# Patient Record
Sex: Male | Born: 1965 | Race: White | Hispanic: No | Marital: Single | Smoking: Never smoker
Health system: Southern US, Community
[De-identification: ages and names within clinical notes are randomized; demographics above are authoritative.]

## PROBLEM LIST (undated history)

## (undated) DIAGNOSIS — I10 Essential (primary) hypertension: Secondary | ICD-10-CM

## (undated) DIAGNOSIS — E119 Type 2 diabetes mellitus without complications: Secondary | ICD-10-CM

---

## 2019-06-02 ENCOUNTER — Encounter: Payer: Self-pay | Admitting: Emergency Medicine

## 2019-06-02 ENCOUNTER — Emergency Department: Payer: Medicaid - Out of State

## 2019-06-02 ENCOUNTER — Emergency Department
Admission: EM | Admit: 2019-06-02 | Discharge: 2019-06-02 | Disposition: A | Discharge status: Home / Self Care | Payer: Medicaid - Out of State | Attending: Emergency Medicine | Admitting: Emergency Medicine

## 2019-06-02 ENCOUNTER — Other Ambulatory Visit: Payer: Self-pay

## 2019-06-02 DIAGNOSIS — N50812 Left testicular pain: Secondary | ICD-10-CM | POA: Diagnosis not present

## 2019-06-02 DIAGNOSIS — K4020 Bilateral inguinal hernia, without obstruction or gangrene, not specified as recurrent: Secondary | ICD-10-CM | POA: Diagnosis not present

## 2019-06-02 DIAGNOSIS — I10 Essential (primary) hypertension: Secondary | ICD-10-CM | POA: Diagnosis not present

## 2019-06-02 DIAGNOSIS — E119 Type 2 diabetes mellitus without complications: Secondary | ICD-10-CM | POA: Insufficient documentation

## 2019-06-02 DIAGNOSIS — R1031 Right lower quadrant pain: Secondary | ICD-10-CM

## 2019-06-02 DIAGNOSIS — N50811 Right testicular pain: Secondary | ICD-10-CM | POA: Insufficient documentation

## 2019-06-02 DIAGNOSIS — N50819 Testicular pain, unspecified: Secondary | ICD-10-CM

## 2019-06-02 HISTORY — DX: Type 2 diabetes mellitus without complications: E11.9

## 2019-06-02 HISTORY — DX: Essential (primary) hypertension: I10

## 2019-06-02 LAB — COMPREHENSIVE METABOLIC PANEL
ALT: 24 U/L (ref 0–44)
AST: 23 U/L (ref 15–41)
Albumin: 3.7 g/dL (ref 3.5–5.0)
Alkaline Phosphatase: 66 U/L (ref 38–126)
Anion gap: 13 (ref 5–15)
BUN: 13 mg/dL (ref 6–20)
CO2: 18 mmol/L — ABNORMAL LOW (ref 22–32)
Calcium: 9.2 mg/dL (ref 8.9–10.3)
Chloride: 104 mmol/L (ref 98–111)
Creatinine, Ser: 1.06 mg/dL (ref 0.61–1.24)
GFR calc Af Amer: >60 mL/min (ref >60)
GFR calc non Af Amer: >60 mL/min (ref >60)
Glucose, Bld: 371 mg/dL — ABNORMAL HIGH (ref 70–99)
Potassium: 3.8 mmol/L (ref 3.5–5.1)
Sodium: 135 mmol/L (ref 135–145)
Total Bilirubin: 0.7 mg/dL (ref 0.3–1.2)
Total Protein: 7.1 g/dL (ref 6.5–8.1)

## 2019-06-02 LAB — CBC
HCT: 44.1 % (ref 39.0–52.0)
Hemoglobin: 15.6 g/dL (ref 13.0–17.0)
MCH: 28.5 pg (ref 26.0–34.0)
MCHC: 35.4 g/dL (ref 30.0–36.0)
MCV: 80.5 fL (ref 80.0–100.0)
Platelets: 239 10*3/uL (ref 150–400)
RBC: 5.48 MIL/uL (ref 4.22–5.81)
RDW: 13.2 % (ref 11.5–15.5)
WBC: 7.4 10*3/uL (ref 4.0–10.5)
nRBC: 0 % (ref 0.0–0.2)

## 2019-06-02 LAB — URINALYSIS, COMPLETE (UACMP) WITH MICROSCOPIC
Bacteria, UA: NONE SEEN
Bilirubin Urine: NEGATIVE
Glucose, UA: >=500 mg/dL — AB
Hgb urine dipstick: NEGATIVE
Ketones, ur: NEGATIVE mg/dL
Leukocytes,Ua: NEGATIVE
Nitrite: NEGATIVE
Protein, ur: NEGATIVE mg/dL
Specific Gravity, Urine: 1.026 (ref 1.005–1.030)
pH: 7 (ref 5.0–8.0)

## 2019-06-02 LAB — LACTIC ACID, PLASMA: Lactic Acid, Venous: 1.8 mmol/L (ref 0.5–1.9)

## 2019-06-02 LAB — LIPASE, BLOOD: Lipase: 42 U/L (ref 11–51)

## 2019-06-02 MED ORDER — OXYCODONE-ACETAMINOPHEN 5-325 MG PO TABS: 1.0000 | ORAL_TABLET | ORAL | 0 refills | Status: AC | PRN

## 2019-06-02 MED ORDER — OXYCODONE-ACETAMINOPHEN 5-325 MG PO TABS
1.0000 | ORAL_TABLET | ORAL | Status: DC | PRN
  Administered 2019-06-02: 1 via ORAL
  Filled 2019-06-02: qty 1

## 2019-06-02 MED ORDER — ONDANSETRON HCL 4 MG/2ML IJ SOLN
4.0000 mg | Freq: Once | INTRAMUSCULAR | Status: AC
  Administered 2019-06-02: 4 mg via INTRAVENOUS
  Filled 2019-06-02: qty 2

## 2019-06-02 MED ORDER — LACTATED RINGERS IV BOLUS
1000.0000 mL | Freq: Once | INTRAVENOUS | Status: AC
  Administered 2019-06-02: 1000 mL via INTRAVENOUS

## 2019-06-02 MED ORDER — IOHEXOL 300 MG/ML  SOLN
125.0000 mL | Freq: Once | INTRAMUSCULAR | Status: AC | PRN
  Administered 2019-06-02: 125 mL via INTRAVENOUS

## 2019-06-02 MED ORDER — MORPHINE SULFATE (PF) 4 MG/ML IV SOLN
4.0000 mg | Freq: Once | INTRAVENOUS | Status: AC
  Administered 2019-06-02: 4 mg via INTRAVENOUS
  Filled 2019-06-02: qty 1

## 2019-06-02 MED ORDER — LISINOPRIL 5 MG PO TABS: 5.0000 mg | ORAL_TABLET | Freq: Every day | ORAL | 0 refills | Status: AC

## 2019-06-02 NOTE — ED Notes (Signed)
Pt st "I haven't taken my BP medication in a while' I I do not have insurance". St last time he took medication "moveber 2019".

## 2019-06-02 NOTE — ED Notes (Addendum)
Pt unable to provide a urine specimen at this time. Given specimen cup for when is able to do so. Pt wheeled to [place].

## 2019-06-02 NOTE — ED Provider Notes (Signed)
[facility] Emergency Department Provider Note   ____________________________________________   First MD Initiated Contact with Patient 06/02/19 1147     (approximate)  I have reviewed the triage vital signs and the nursing notes.   HISTORY  Chief Complaint Abdominal Pain    HPI Cory Carey is a 53 y.o. male with possible history of hypertension and diabetes presents to the ED complaining of abdominal pain.  Patient reports that he was diagnosed with a hernia in the right side of his groin about 1 year ago.  He states he has been developing increasing pain in his right groin and right lower quadrant over the past 2 days and pain has now become severe.  He states the right inguinal region is very tender to palpation, but he has not had any nausea, vomiting, changes in bowel movements, dysuria, or hematuria.  He is concerned the pain is related to the hernia as he has seen swelling in this area.  He has never had issues with a hernia before.        Past Medical History:  Diagnosis Date  . Diabetes mellitus without complication ([place])   . Hypertension     There are no problems to display for this patient.   History reviewed. No pertinent surgical history.  Prior to Admission medications   Medication Sig Start Date End Date Taking? Authorizing Provider  oxyCODONE-acetaminophen (PERCOCET) 5-325 MG tablet Take 1 tablet by mouth every 4 (four) hours as needed for severe pain. 06/02/19 06/01/20  Blake Divine, MD    Allergies Patient has no known allergies.  No family history on file.  Social History Social History   Tobacco Use  . Smoking status: Never Smoker  . Smokeless tobacco: Never Used  Substance Use Topics  . Alcohol use: Yes    Comment: occasional  . Drug use: Not on file    Review of Systems  Constitutional: No fever/chills Eyes: No visual changes. ENT: No sore throat. Cardiovascular: Denies chest  pain. Respiratory: Denies shortness of breath. Gastrointestinal: Positive for abdominal pain.  No nausea, no vomiting.  No diarrhea.  No constipation. Genitourinary: Negative for dysuria. Musculoskeletal: Negative for back pain. Skin: Negative for rash. Neurological: Negative for headaches, focal weakness or numbness.  ____________________________________________   PHYSICAL EXAM:  VITAL SIGNS: ED Triage Vitals  Enc Vitals Group     BP 06/02/19 1107 (!) 163/105     Pulse Rate 06/02/19 1107 88     Resp 06/02/19 1107 20     Temp 06/02/19 1107 98 F (36.7 C)     Temp Source 06/02/19 1107 Oral     SpO2 06/02/19 1107 98 %     Weight 06/02/19 1103 240 lb (108.9 kg)     Height 06/02/19 1103 5\' 10"  (1.778 m)     Head Circumference --      Peak Flow --      Pain Score 06/02/19 1103 10     Pain Loc --      Pain Edu? --      Excl. in [place]? --     Constitutional: Alert and oriented. Eyes: Conjunctivae are normal. Head: Atraumatic. Nose: No congestion/rhinnorhea. Mouth/Throat: Mucous membranes are moist. Neck: Normal ROM Cardiovascular: Normal rate, regular rhythm. Grossly normal heart sounds. Respiratory: Normal respiratory effort.  No retractions. Lungs CTAB. Gastrointestinal: Soft and tender to palpation in the right lower quadrant with no rebound or guarding. No distention. Genitourinary: Bilateral testicular tenderness, right greater than left.  No scrotal lesions, erythema, or edema.  Musculoskeletal: No lower extremity tenderness nor edema. Neurologic:  Normal speech and language. No gross focal neurologic deficits are appreciated. Skin:  Skin is warm, dry and intact. No rash noted. Psychiatric: Mood and affect are normal. Speech and behavior are normal.  ____________________________________________   LABS (all labs ordered are listed, but only abnormal results are displayed)  Labs Reviewed  COMPREHENSIVE METABOLIC PANEL - Abnormal; Notable for the following components:       Result Value   CO2 18 (*)    Glucose, Bld 371 (*)    All other components within normal limits  URINALYSIS, COMPLETE (UACMP) WITH MICROSCOPIC - Abnormal; Notable for the following components:   Color, Urine STRAW (*)    APPearance CLEAR (*)    Glucose, UA >=500 (*)    All other components within normal limits  GC/CHLAMYDIA PROBE AMP  LIPASE, BLOOD  CBC  LACTIC ACID, PLASMA  LACTIC ACID, PLASMA    PROCEDURES  Procedure(s) performed (including Critical Care):  Procedures   ____________________________________________   INITIAL IMPRESSION / ASSESSMENT AND PLAN / ED COURSE       53 year old male presents to the ED with right lower quadrant and right inguinal pain worsening over the past 2 days, which she attributes to known inguinal hernia.  On exam, I am not able to palpate a significant hernia, although he has tenderness both in his right lower quadrant as well as around both testicles.  Maximal pain appears to be at the area of his testicles, but he denies any urinary symptoms or discharge.  Will assess for incarcerated versus strangulated hernia with CT first, however if this is not visualized the primary source of his discomfort may be testicular.  Lab work is thus far unremarkable and UA without evidence of infection.  CT scan showed bilateral fat containing inguinal hernias without evidence of incarceration or strangulation, lactate is also within normal limits so I doubt these hernias are the source of his pain.  Testicular ultrasound was obtained did not show any evidence of epididymitis, but did show fatty infiltration of testicles bilaterally.  This was discussed with urology and is of unclear significance.  Urology recommends pain control as well as elevation of testicles, follow-up in the office.  Patient was counseled on return precautions and counseled to follow-up with urology, patient agrees with plan.       ____________________________________________   FINAL CLINICAL IMPRESSION(S) / ED DIAGNOSES  Final diagnoses:  Right inguinal pain  Pain in both testicles     ED Discharge Orders         Ordered    oxyCODONE-acetaminophen (PERCOCET) 5-325 MG tablet  Every 4 hours PRN     06/02/19 1519           Note:  This document was prepared using Dragon voice recognition software and may include unintentional dictation errors.   Chesley Noon, MD 06/02/19 [phone]

## 2019-06-02 NOTE — Discharge Instructions (Signed)
Your CT scan today did not show evidence of a large hernia and there does not appear to be any signs of a hernia causing her pain.  The ultrasound of your testicles did show fatty tissue accumulating in both testicles, which is of unclear significance.  You may take pain medication as needed for your symptoms and keep your testicles elevated to help with the pain.  Please schedule follow-up with a urologist and return to the ER for worsening symptoms.

## 2019-06-02 NOTE — ED Triage Notes (Signed)
Presents with right lower abd pain  States he has a hernia  Developed increased pain 2 days ago

## 2019-06-02 NOTE — ED Notes (Signed)
Pt ambulatory to the lobby at this time to await his ride. Driving precautions reviewed with patient. Pt's BP discussed with Dr. Jari Pigg, pt states to this RN he used to take Lisinopril 5mg , prescription sent to pharmacy for Lisinopril 5mg  until patient can follow up with PCP. Pt ambulatory with steady gait to the lobby at this time.

## 2019-06-02 NOTE — ED Notes (Signed)
Pt c/o LLQ pain. St dx with a hernia a year ago. No past surgeries.  ;gotten progressively worse over the past 2 days.

## 2019-06-05 LAB — GC/CHLAMYDIA PROBE AMP
Chlamydia trachomatis, NAA: NEGATIVE
Neisseria Gonorrhoeae by PCR: NEGATIVE

## 2019-06-22 ENCOUNTER — Other Ambulatory Visit: Payer: Self-pay

## 2019-06-22 ENCOUNTER — Encounter: Payer: Self-pay | Admitting: *Deleted

## 2019-06-22 DIAGNOSIS — N50811 Right testicular pain: Secondary | ICD-10-CM | POA: Insufficient documentation

## 2019-06-22 DIAGNOSIS — N50812 Left testicular pain: Secondary | ICD-10-CM | POA: Insufficient documentation

## 2019-06-22 DIAGNOSIS — R109 Unspecified abdominal pain: Secondary | ICD-10-CM | POA: Diagnosis present

## 2019-06-22 DIAGNOSIS — Z5321 Procedure and treatment not carried out due to patient leaving prior to being seen by health care provider: Secondary | ICD-10-CM | POA: Diagnosis not present

## 2019-06-22 LAB — BASIC METABOLIC PANEL
Anion gap: 12 (ref 5–15)
BUN: 15 mg/dL (ref 6–20)
CO2: 20 mmol/L — ABNORMAL LOW (ref 22–32)
Calcium: 9.7 mg/dL (ref 8.9–10.3)
Chloride: 103 mmol/L (ref 98–111)
Creatinine, Ser: 1.18 mg/dL (ref 0.61–1.24)
GFR calc Af Amer: >60 mL/min (ref >60)
GFR calc non Af Amer: >60 mL/min (ref >60)
Glucose, Bld: 257 mg/dL — ABNORMAL HIGH (ref 70–99)
Potassium: 3.7 mmol/L (ref 3.5–5.1)
Sodium: 135 mmol/L (ref 135–145)

## 2019-06-22 LAB — CBC
HCT: 44.7 % (ref 39.0–52.0)
Hemoglobin: 15.2 g/dL (ref 13.0–17.0)
MCH: 28.3 pg (ref 26.0–34.0)
MCHC: 34 g/dL (ref 30.0–36.0)
MCV: 83.2 fL (ref 80.0–100.0)
Platelets: 253 10*3/uL (ref 150–400)
RBC: 5.37 MIL/uL (ref 4.22–5.81)
RDW: 12.2 % (ref 11.5–15.5)
WBC: 9.3 10*3/uL (ref 4.0–10.5)
nRBC: 0 % (ref 0.0–0.2)

## 2019-06-22 MED ORDER — SODIUM CHLORIDE 0.9% FLUSH: 3.0000 mL | Freq: Once | INTRAVENOUS | Status: DC

## 2019-06-22 NOTE — ED Triage Notes (Signed)
Pt brought in via ems for inguinal hernia pain.  Pt was dx 06-02-19 and seen in our ER  Pt has continued pain and bilateral testicle pain.  No n/v/  Pt alert  Speech clear.

## 2019-06-23 ENCOUNTER — Emergency Department
Admission: EM | Admit: 2019-06-23 | Discharge: 2019-06-23 | Disposition: A | Discharge status: Home / Self Care | Payer: Medicaid - Out of State | Attending: Emergency Medicine | Admitting: Emergency Medicine

## 2019-06-23 MED ORDER — IOHEXOL 9 MG/ML PO SOLN: 500.0000 mL | Freq: Once | ORAL | Status: DC | PRN

## 2019-06-23 NOTE — ED Notes (Signed)
No answer when called several times from lobby 

## 2021-04-22 IMAGING — US US SCROTUM W/ DOPPLER COMPLETE
1 series · 13 of 25 positions shown · non-contrast
Comparison: None.

CLINICAL DATA: Testicle pain.

EXAM:
SCROTAL ULTRASOUND
DOPPLER ULTRASOUND OF THE TESTICLES
TECHNIQUE: Complete ultrasound examination of the testicles, epididymis, and
other scrotal structures was performed. Color and spectral Doppler
ultrasound were also utilized to evaluate blood flow to the
testicles.

[Series 1: us scrotum w/ doppler complete · 13 of 54 slices shown]
[im 1/54]
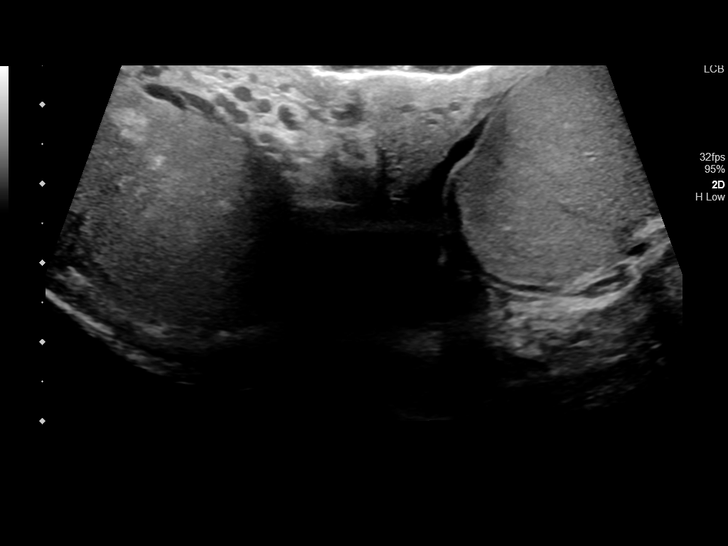
[im 5/54]
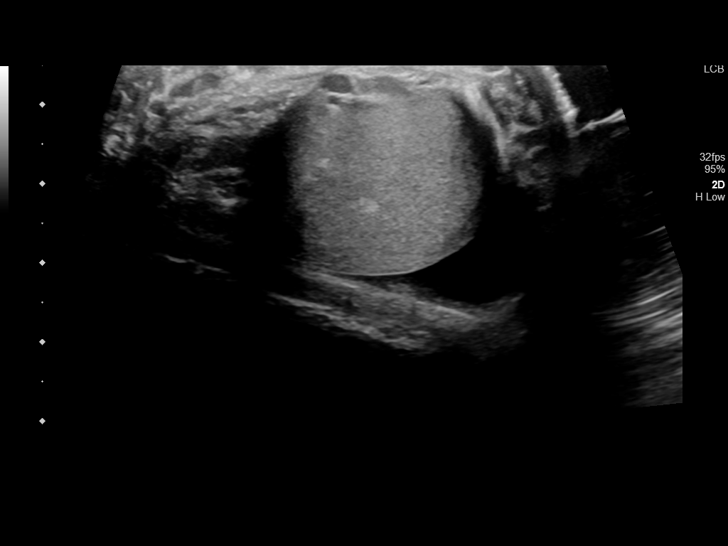
[im 9/54]
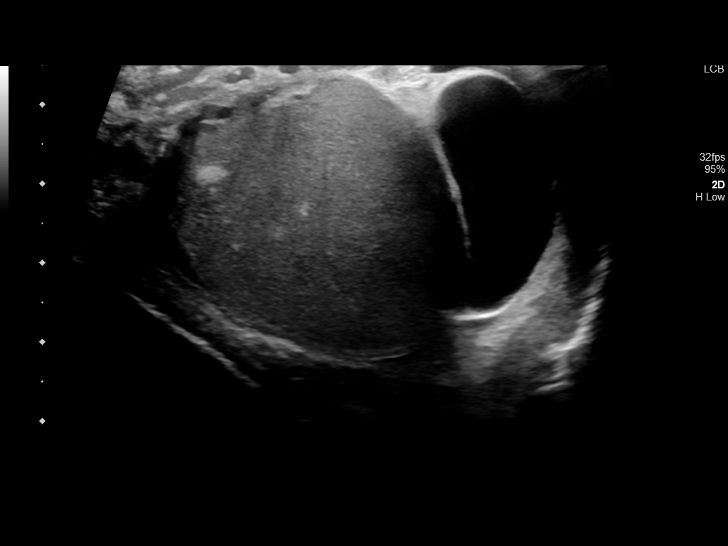
[im 14/54]
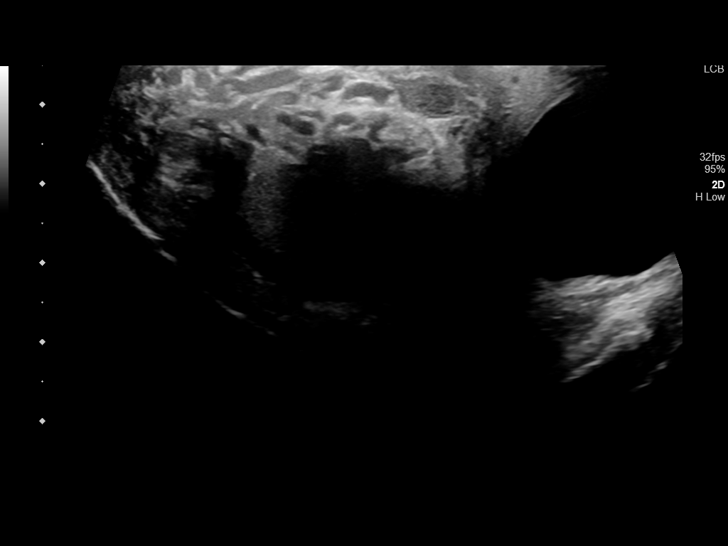
[im 18/54]
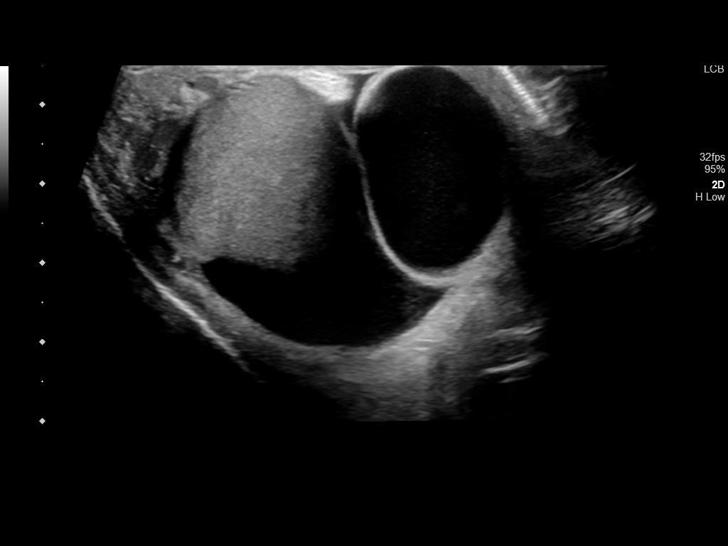
[im 23/54]
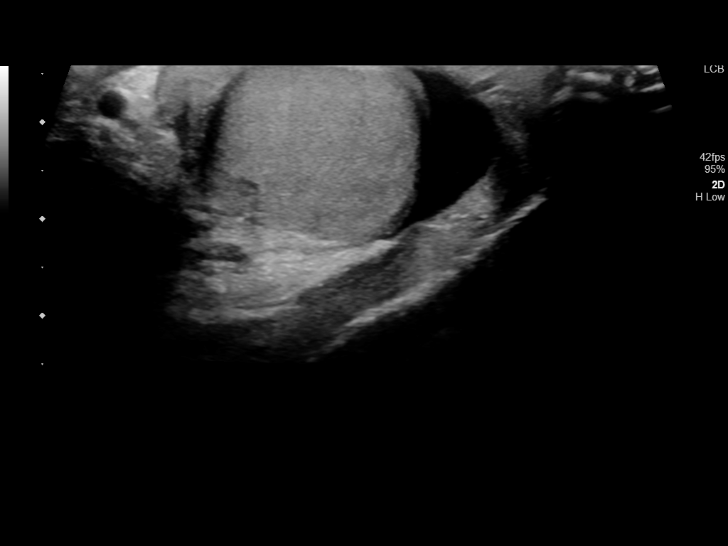
[im 27/54]
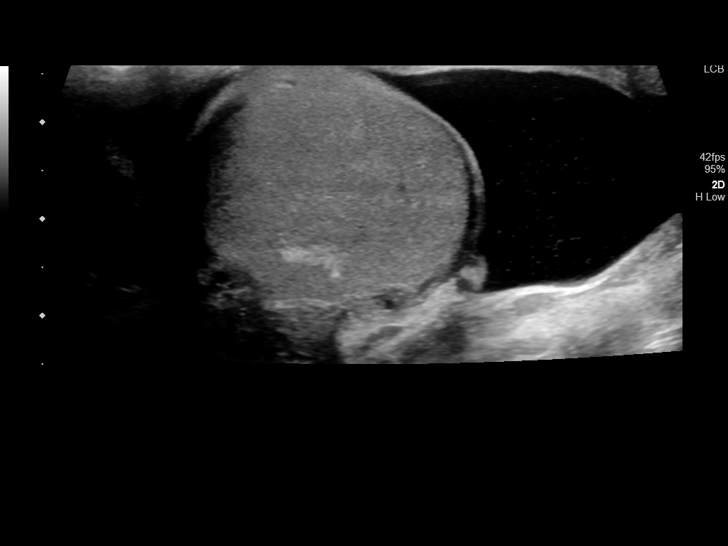
[im 31/54]
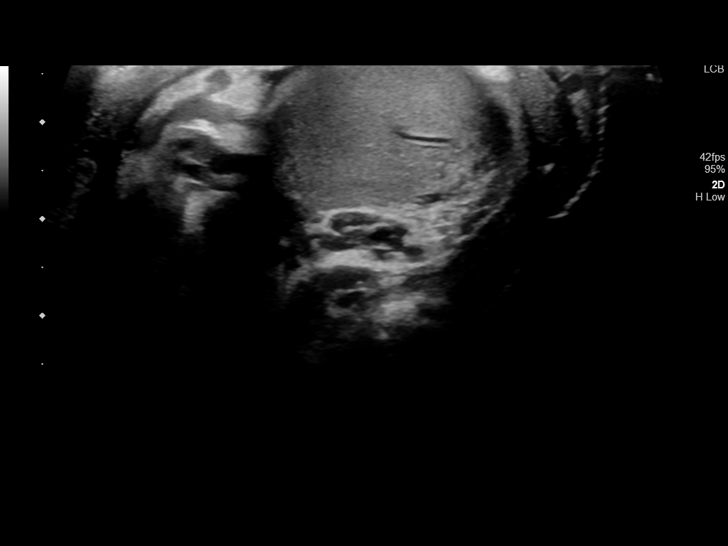
[im 36/54]
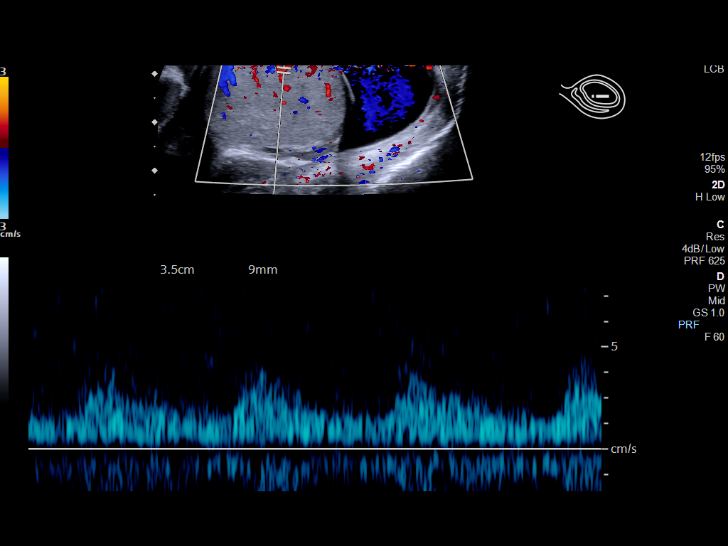
[im 40/54]
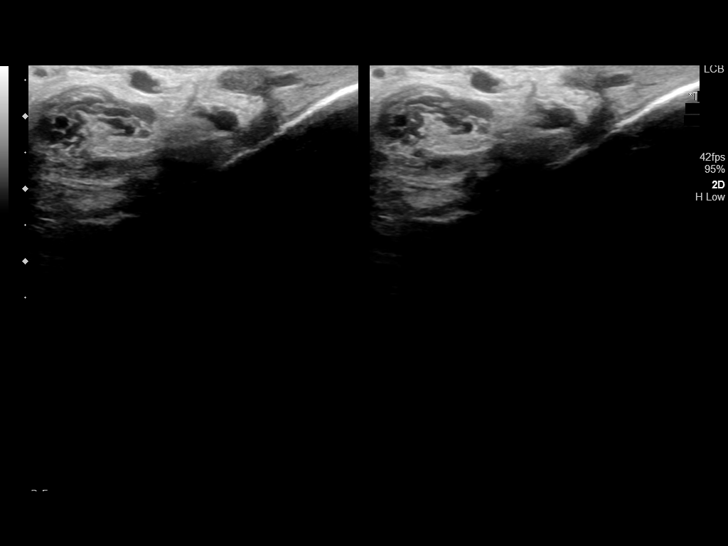
[im 45/54]
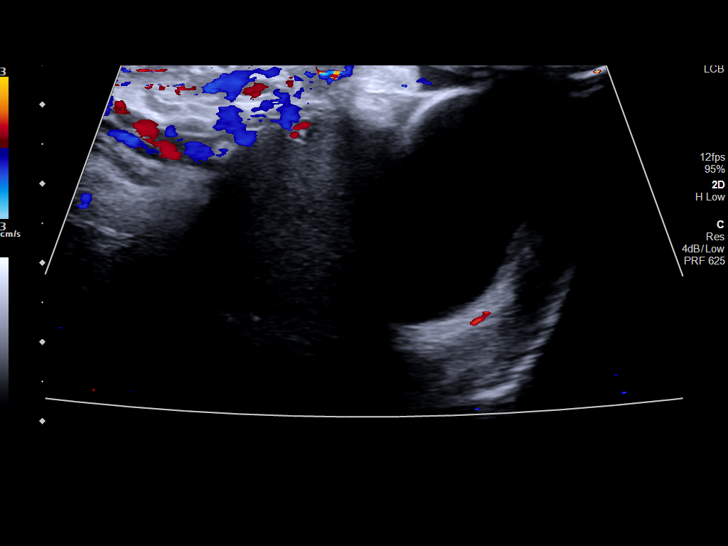
[im 49/54]
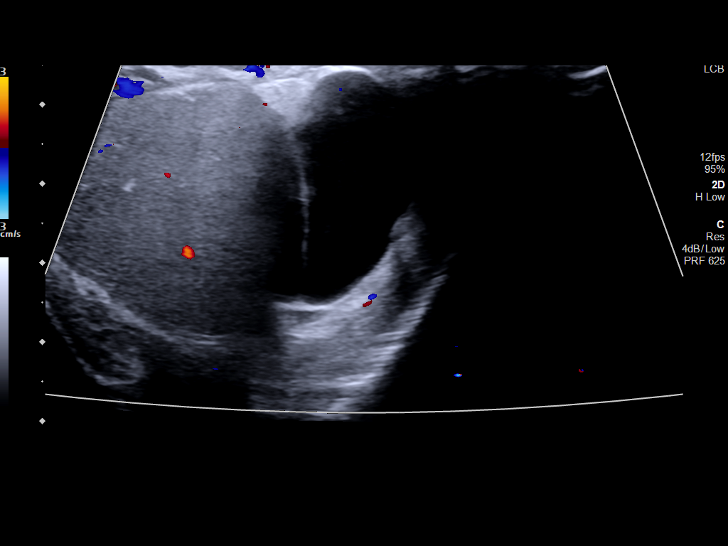
[im 54/54]
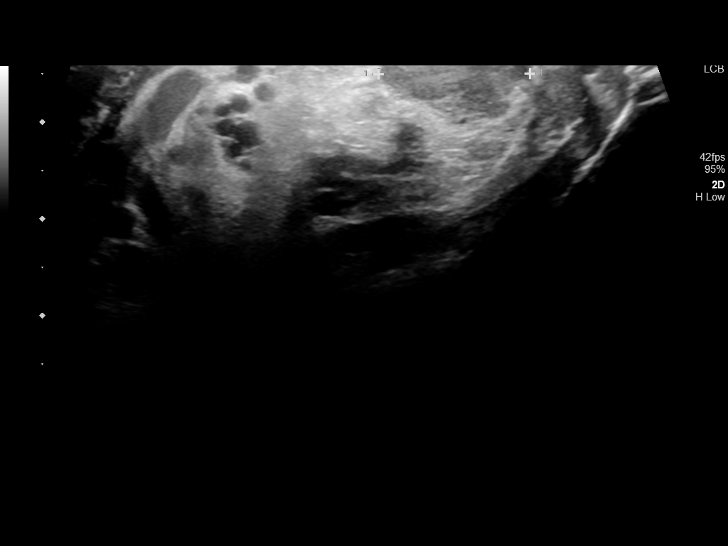

[13 of 25 positions shown; findings below may reference images not displayed]

FINDINGS: Right testicle

Measurements: 4.1 x 3.1 x 3.2 cm. There are multiple small echogenic
areas within the testicle of various sizes, the largest being 4 mm.

Left testicle

Measurements: 3.4 x 2.0 x 2.5 cm. There are multiple small echogenic
areas within the left testicle.

Right epididymis: There is a 1.9 cm simple cyst on the right
epididymis. The epididymis is otherwise normal.

Left epididymis:  Normal in size and appearance.

Hydrocele:  Small bilateral hydroceles.

Varicocele:  None visualized.

Pulsed Doppler interrogation of both testes demonstrates normal low
resistance arterial and venous waveforms bilaterally.
IMPRESSION: Multiple small lesions in both testicles are consistent with
lipomatosis of the testicles. This can be seen with Cowden syndrome
and Bannayan-Riley-Ruvalcaba Syndrome.

Prominent right epididymal cyst without evidence of epididymitis.

Small bilateral hydroceles.

## 2021-04-22 IMAGING — CT CT ABD-PELV W/ CM
2 of 5 series · 16 of 46 positions shown, 18 images · IV contrast (APPLIED)
Comparison: None.

CLINICAL DATA: Left lower quadrant abdominal pain and history of
left inguinal hernia.

EXAM:
CT ABDOMEN AND PELVIS WITH CONTRAST
TECHNIQUE: Multidetector CT imaging of the abdomen and pelvis was performed
using the standard protocol following bolus administration of
intravenous contrast.
CONTRAST:  125mL OMNIPAQUE IOHEXOL 300 MG/ML  SOLN

[Series 2: routine abd/pel with · axial · 0.85mm/px · z∈[-530,-125]mm · 13 of 93 slices shown, 15 images]
[im 6/93  soft-tissue]
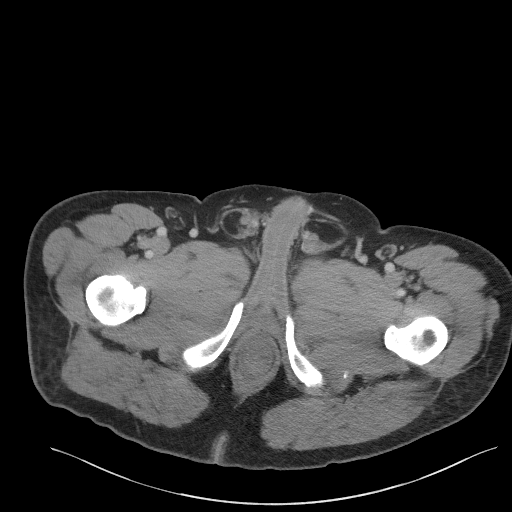
[im 6/93  bone]
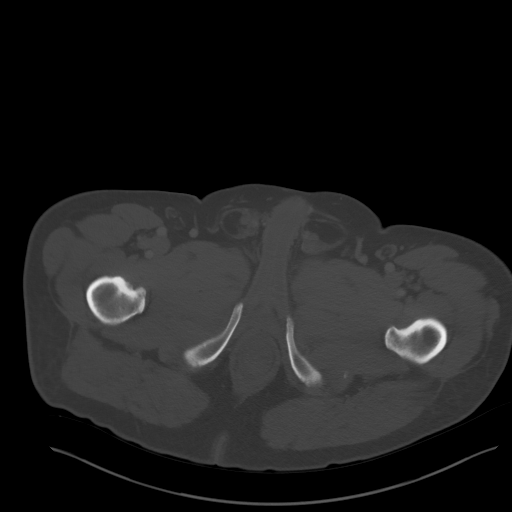
[im 11/93  soft-tissue]
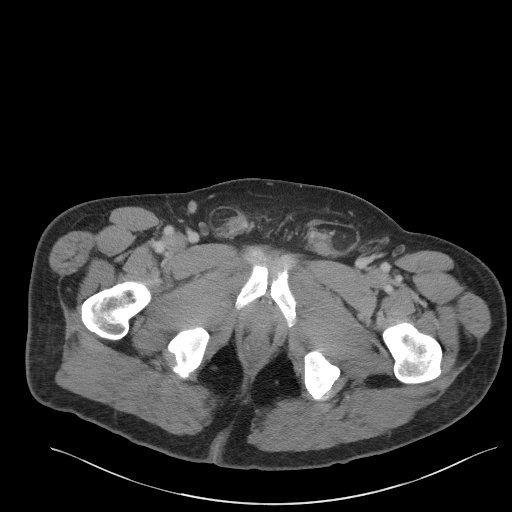
[im 21/93  soft-tissue]
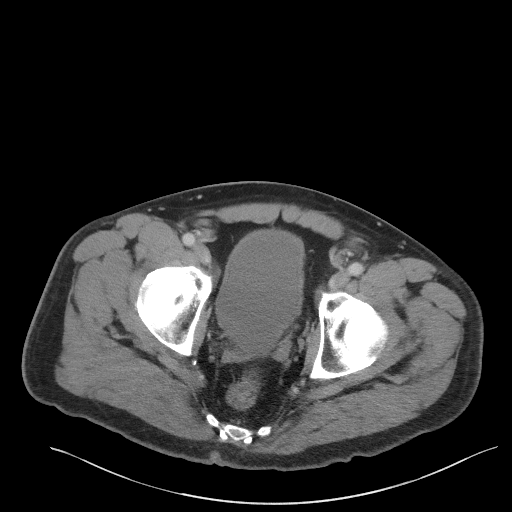
[im 26/93  soft-tissue]
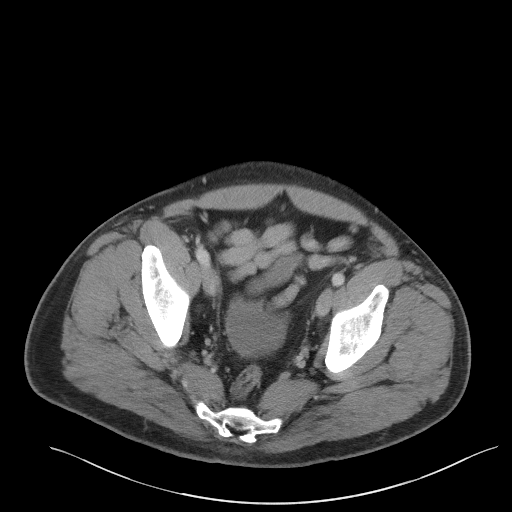
[im 31/93  soft-tissue]
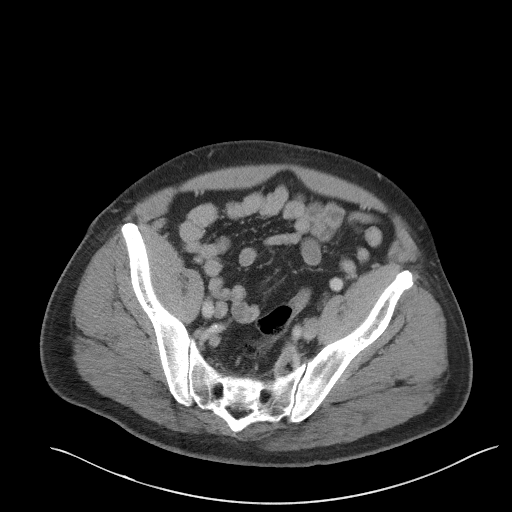
[im 41/93  soft-tissue]
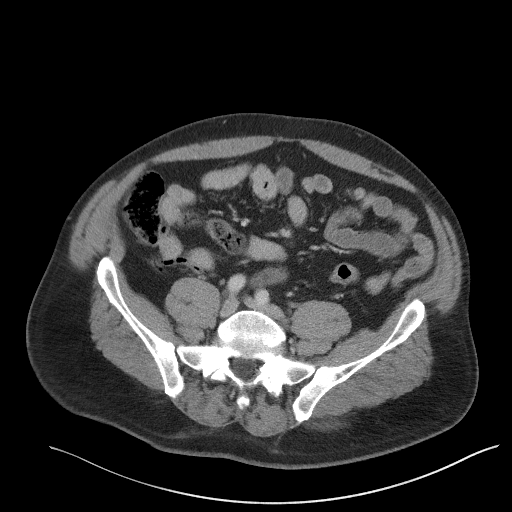
[im 47/93  soft-tissue]
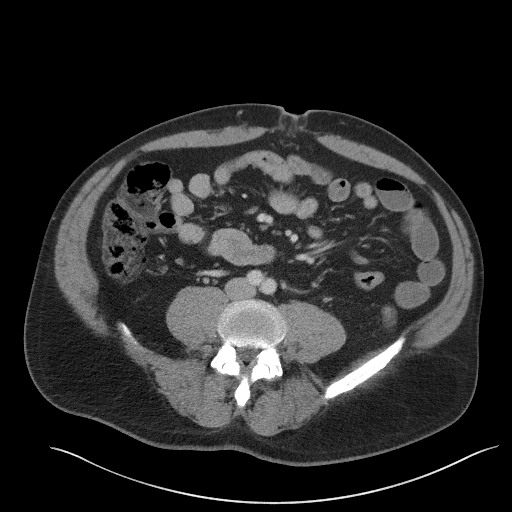
[im 52/93  soft-tissue]
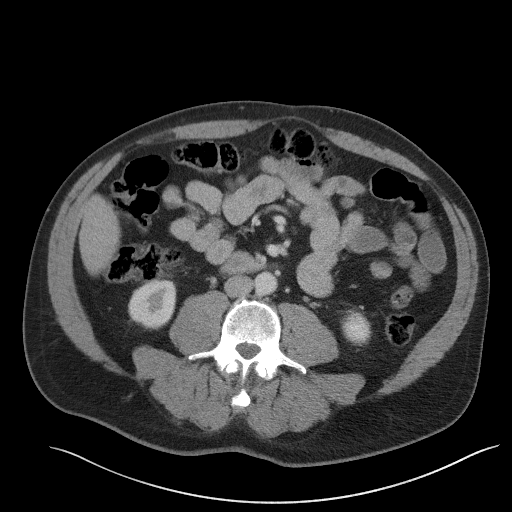
[im 62/93  soft-tissue]
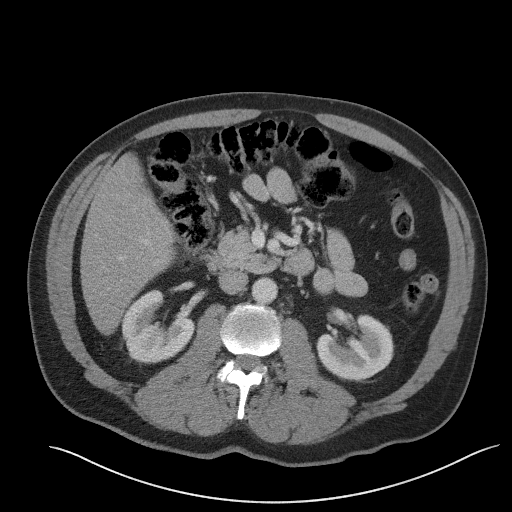
[im 62/93  bone]
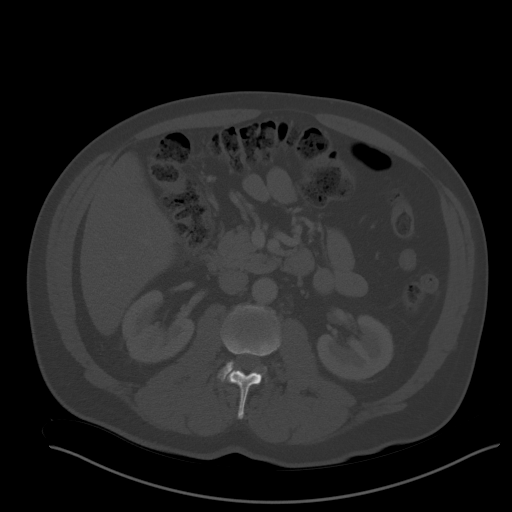
[im 67/93  soft-tissue]
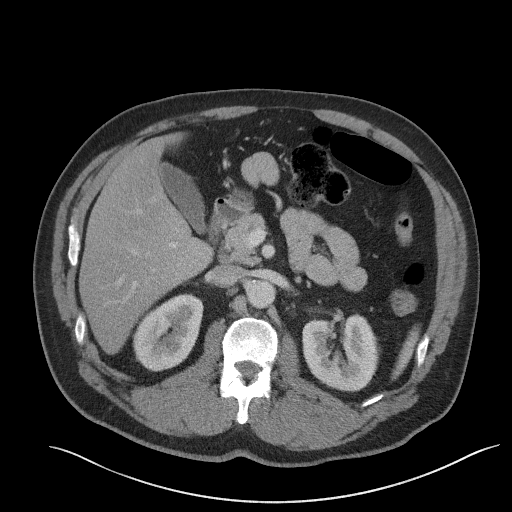
[im 72/93  soft-tissue]
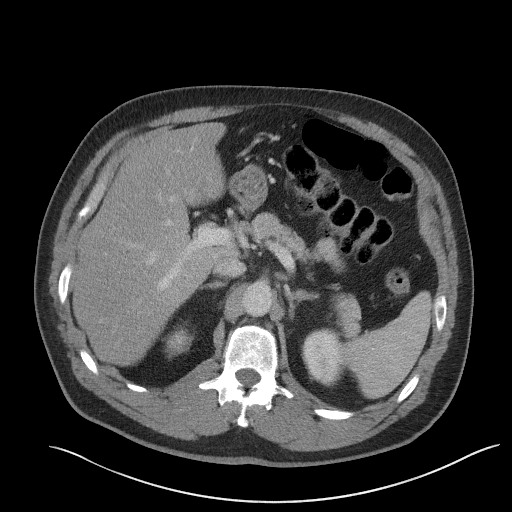
[im 82/93  soft-tissue]
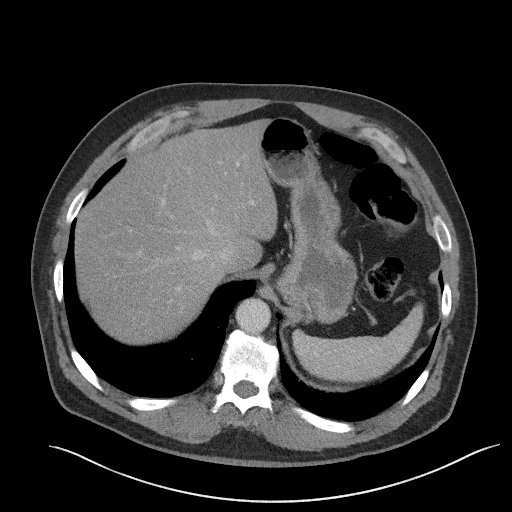
[im 87/93  soft-tissue]
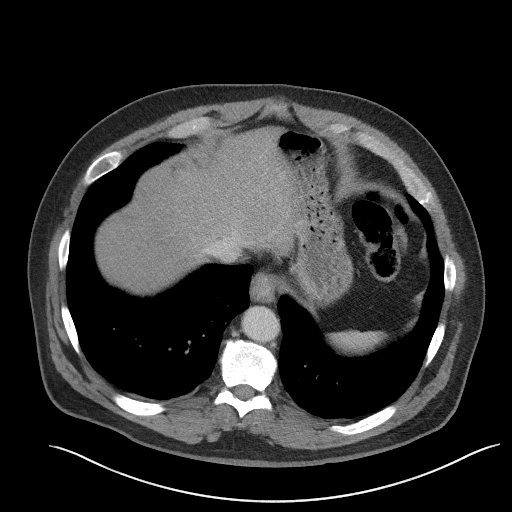

[Series 5: coronal st · coronal · 0.79mm/px · 3 of 111 slices shown]
[im 37/111  soft-tissue]
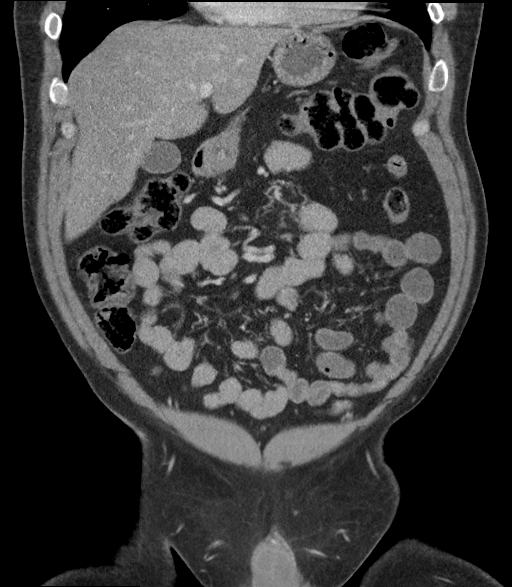
[im 49/111  soft-tissue]
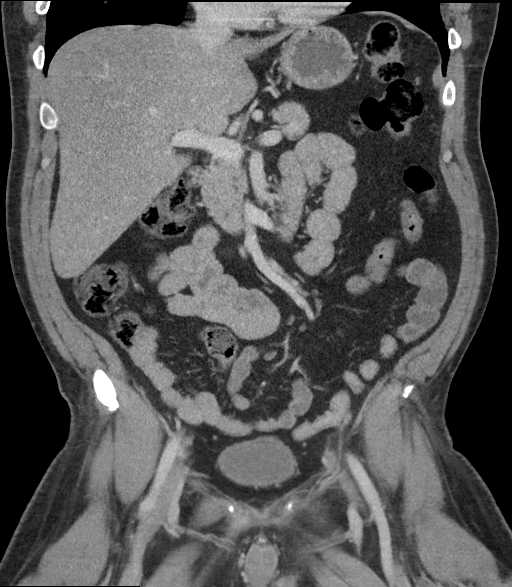
[im 62/111  soft-tissue]
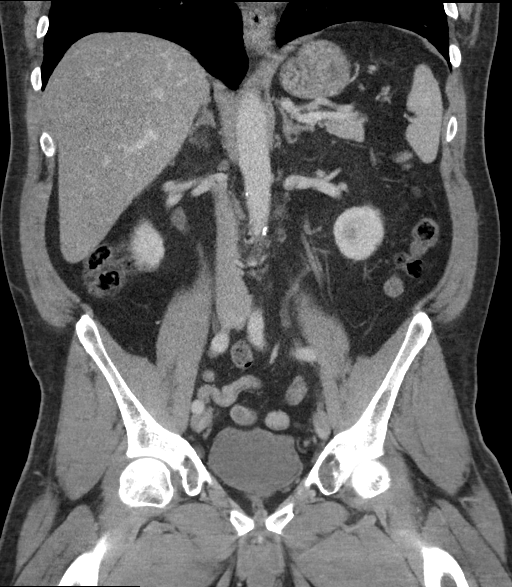

[16 of 46 positions shown; findings below may reference images not displayed]

FINDINGS: Lower chest: No acute abnormality.

Hepatobiliary: No focal liver abnormality is seen. No gallstones,
gallbladder wall thickening, or biliary dilatation.

Pancreas: Unremarkable. No pancreatic ductal dilatation or
surrounding inflammatory changes.

Spleen: Normal in size without focal abnormality.

Adrenals/Urinary Tract: Adrenal glands are unremarkable. Kidneys are
normal, without renal calculi, focal lesion, or hydronephrosis.
Bladder is unremarkable.

Stomach/Bowel: No evidence of bowel obstruction, ileus, inflammatory
process or lesion. The appendix is normal. No diverticulosis or
diverticulitis. Benign mesenteric calcification in the anterior left
mid abdomen.

Vascular/Lymphatic: No significant vascular findings are present. No
enlarged abdominal or pelvic lymph nodes.

Reproductive: Prostate is unremarkable.

Other: Bilateral fat containing inguinal hernias, left greater than
right. No evidence of hernia incarceration or strangulation. Tiny
umbilical hernia containing fat.

Musculoskeletal: No acute or significant osseous findings.
IMPRESSION: Bilateral fat containing inguinal hernias, left greater than right.
Tiny umbilical hernia containing fat. No evidence of acute hernia
incarceration or strangulation.
